# Patient Record
Sex: Female | Born: 1965
Health system: Southern US, Community
[De-identification: ages and names within clinical notes are randomized; demographics above are authoritative.]

## PROBLEM LIST (undated history)

## (undated) HISTORY — PX: MYOMECTOMY: SHX85

---

## 2000-10-01 ENCOUNTER — Encounter: Admission: RE | Admit: 2000-10-01 | Discharge: 2000-10-01 | Payer: Self-pay | Admitting: Family Medicine

## 2000-10-01 ENCOUNTER — Encounter: Payer: Self-pay | Admitting: Family Medicine

## 2000-10-20 ENCOUNTER — Ambulatory Visit (HOSPITAL_COMMUNITY): Admission: RE | Admit: 2000-10-20 | Discharge: 2000-10-20 | Payer: Self-pay | Admitting: Family Medicine

## 2000-10-20 ENCOUNTER — Encounter: Payer: Self-pay | Admitting: Obstetrics and Gynecology

## 2000-11-27 ENCOUNTER — Encounter: Payer: Self-pay | Admitting: Gynecology

## 2000-12-02 ENCOUNTER — Inpatient Hospital Stay (HOSPITAL_COMMUNITY): Admission: RE | Admit: 2000-12-02 | Discharge: 2000-12-04 | Payer: Self-pay | Admitting: Gynecology

## 2000-12-02 ENCOUNTER — Encounter (INDEPENDENT_AMBULATORY_CARE_PROVIDER_SITE_OTHER): Payer: Self-pay | Admitting: Specialist

## 2009-01-12 ENCOUNTER — Ambulatory Visit: Payer: Self-pay | Admitting: Advanced Practice Midwife

## 2009-01-12 ENCOUNTER — Inpatient Hospital Stay (HOSPITAL_COMMUNITY): Admission: AD | Admit: 2009-01-12 | Discharge: 2009-01-12 | Payer: Self-pay | Admitting: Obstetrics & Gynecology

## 2009-03-01 ENCOUNTER — Encounter: Admission: RE | Admit: 2009-03-01 | Discharge: 2009-03-01 | Payer: Self-pay | Admitting: Obstetrics and Gynecology

## 2009-03-06 ENCOUNTER — Encounter: Admission: RE | Admit: 2009-03-06 | Discharge: 2009-03-06 | Payer: Self-pay | Admitting: Obstetrics and Gynecology

## 2009-09-29 ENCOUNTER — Encounter: Admission: RE | Admit: 2009-09-29 | Discharge: 2009-09-29 | Payer: Self-pay | Admitting: Obstetrics and Gynecology

## 2010-04-10 ENCOUNTER — Encounter: Admission: RE | Admit: 2010-04-10 | Discharge: 2010-04-10 | Payer: Self-pay | Admitting: Obstetrics and Gynecology

## 2010-09-23 LAB — POCT PREGNANCY, URINE: Preg Test, Ur: NEGATIVE

## 2010-11-02 NOTE — H&P (Signed)
[facility]  Patient:    Alejandra Baker, Alejandra Baker                   MRN: [id number] Adm. Date:  [id number] Attending:  Rolinda Roan CC:         Beather Arbour. Thomasena Edis, M.D.  Dellis Anes Idell Pickles, M.D.   History and Physical  ADMITTING DIAGNOSIS:  Fibroid uterus.  HISTORY OF PRESENT ILLNESS:  The patient is a 45 year old nulligravida white female kindly referred by Dr. Lafonda Mosses B. Collins for evaluation and treatment of a large fibroid.  The patient presented with a large pelvic mass to Dr. Dellis Anes. Heller, who, after evaluation, referred the patient to Dr. Lafonda Mosses B. Collins.  Dr. Thomasena Edis performed a CT scan and MR which suggested a degenerating fibroid.  Because the uterus was greater than 22 weeks in size and the patient strongly wished to preserve her fertility, the patient was kindly referred by Dr. Lafonda Mosses B. Collins.  Two extended discussions were held with the patient regarding the potential diagnosis.  The most likely diagnosis would be a degenerating fibroid, but the possibility of uterine sarcoma and adenomyosis have also been discussed in detail with the patient.  Need for exploratory laparotomy and probable myomectomy and possible total abdominal hysterectomy have been reviewed.  The potential complications including, but not limited to, anesthesia, injury to the bowel, bladder, ureters with possible blood loss requiring transfusion and possible infection have been discussed with the patient.  If, on frozen section, the mass appears to be a sarcoma, then Dr. Reuel Boom L. Clarke-Pearson, Chief of Gynecologic Oncology, [facility], will be available as a Artist to become the primary surgeon for all indicated procedures.  Patient understands that given the size of this fibroid, the distortion of the uterus is going to be significant and there is a possibility that it will not be possible to reconstruct the uterus to avoid  hysterectomy.  The patient wishes that all measures be taken to avoid a hysterectomy if there is not cancer.  Patient understands that there is no guarantee that she will be able to achieve a pregnancy postoperatively and that there will be a risk of uterine rupture, given the very likely large involvement of the myometrium.  The patient is to undergo a bowel prep preoperatively.  Patient also understands that there is a significant increased risk of transfusion, given the size of the fibroid. Although the patient was quite anxious during both visits, she appeared to understand well the discussion of the fibroids, the options for therapy and the potential risks of the procedure.  PAST MEDICAL HISTORY:  Surgical:  None.  Medical:  Noncontributory.  MEDICATIONS:  Tylenol PM.  ALLERGIES:  None known.  SOCIAL HISTORY:  Smokes:  None.  ETOH:  None.  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  SOCIAL HISTORY:  Positive for lymphoma and bladder cancer.  PHYSICAL EXAMINATION:  HEENT:  Negative.  HEART:  Without murmurs.  LUNGS:  Clear.  BREASTS:  Without masses or discharge.  ABDOMEN:  A large pelvic mass extending well above the umbilicus.  PELVIC:  Exam reveals the BUS and vagina to be normal with an intact hymen. The cervix cannot be visualized.  The bimanual examination reveals the pelvis to be full with a smooth, firm, but not hard mass and no nodularity is palpated in the mass or in the parametrial tissue.  EXTREMITIES:  Negative.  IMPRESSION:  Large  fibroid uterus.  PLAN:  Exploratory laparotomy, probable myomectomy, possible total abdominal hysterectomy. DD:  12/03/00 TD:  12/04/00 Job: [id number]

## 2010-11-02 NOTE — Op Note (Signed)
[facility]  Patient:    Alejandra Baker, URBACH                   MRN: [id number] Proc. Date: 12/02/00 Adm. Date:  [id number] Attending:  Rolinda Roan CC:         Dellis Anes. Idell Pickles, M.D.  Beather Arbour Thomasena Edis, M.D.   Operative Report  PREOPERATIVE DIAGNOSIS:  Fibroid uterus.  POSTOPERATIVE DIAGNOSIS:  Fibroid uterus.  OPERATION PERFORMED:  Exploratory laparotomy and myomectomy.  SURGEON:  Teodora Medici, M.D.  ASSISTANT:  Beather Arbour. Thomasena Edis, M.D.  ANESTHESIA:  General endotracheal.  PREPARATION:  Betadine.  DESCRIPTION OF PROCEDURE:  With the patient in the supine position, she was prepped and draped in the routine fashion. A catheter was not placed in the uterus secondary to the inability to visualize the cervix. A midline incision was made from below the umbilicus to just above the pubic symphysis. The incision was extended down to the subcutaneous tissue and then the fascia and peritoneum were opened without difficulty. Pelvic washings were obtained which were later discarded after a negative frozen section report. Brief exploration of her upper abdomen was benign. The appendix appeared to be normal. The uterus was approximately 22 weeks in size with a large soft anterior fibroid. An effort was made to deliver the uterus through the incision but given its size vertically as well as laterally, this could not be accomplished. The incision was then extended around the umbilicus. Because the uterus extended well above the umbilicus and has for a while. The umbilicus was significantly distorted and seeing the borders of the umbilicus were difficult. After the incision was extended, the uterus was adequately delivered to perform the myomectomy. The uterus was noted to be significantly rotated with the right round ligament being almost on the top of the uterus. Both ovaries were identified and found to be normal and later the tubes were identified  and found to be normal. The bladder was identified. An effort was made to place the uterus in as much a normal position as possible and the incision to remove the fibroid was placed as close to the midline in the uterus as possible which was significantly asymmetrical with the fibroid. A dilute solution of Pitressin was injected and the uterine serosa opened with cautery. The dissection planes were found without significant difficulty and very slowly and carefully the fibroid was removed taking care not to damage the fallopian tubes, the bladder or the vessels in the broad ligament on the right side where the fibroid significantly extended to. The fibroid was removed intact and there was no apparent entry into the endometrial cavity. Because the cavity was not distended with dye, this could not be determined with certainty. However, the cavity was identified and appeared to be intact. The fibroid was sent to pathology for frozen section and Dr. Ralph Leyden returned a diagnosis of benign leiomyoma. The fibroid weighed over 900 gm. There was a tremendously large defect in the anterior wall of the uterus extending well into the broad ligament on the right side. This made very very difficult uterine reconstruction because of the amount of surface area that needed to be approximated and the amount of bleeding surface. An effort was made to control hemostasis by individually cauterizing or ligating the larger bleeders. The uterus was then reconstructed, first using running #0 chromic sutures in the direction of the muscle fibers and the defect and then as the surface was approached and then to close  the defect in the midline again taking care to protect the bladder, fallopian tubes, uterine artery and vein on the right side. Care was also taken not to intrude on the uterine cavity. This was a very difficult and tedious process. There was a significant amount of dead space left in the broad  ligament where hemostasis appeared to be intact but given the potential oozing from the incision site and repair site had a tremendous potential to collect postoperatively. With the edema in the broad ligament, it was not possible to precisely locate the uterine artery and vein and it was not possible to approximate or close this dead space. After the uterus was completely reconstructed with chromic suture used to repair the serosa pressure was applied to the broad ligament and repair site for five minutes by the clock. After that time, hemostasis appeared to be completely intact. The serosa had been closed with chromic suture given the size of the defect and tension on the suture. PDS is normally employed for this step but in the interest of hemostasis and approximation, chromic was used. The pelvis was irrigated with copious amounts of warm lactated Ringers solution and again the tubes and ovaries appeared to be normal. The bladder appeared to be out of harms way and hemostasis was intact. The abdomen was then closed in layers using a running 2-0 Vicryl on the peritoneum, running #0 PDS on the fascia from the upper and lower poles and staples used to close the skin. The estimated blood loss was 1300 cc. The sponge, needle and instrument counts were correct. The patient tolerated the procedure well and was taken to the recovery room in satisfactory condition. DD:  12/03/00 TD:  12/03/00 Job: [id number]

## 2016-08-07 ENCOUNTER — Encounter (HOSPITAL_BASED_OUTPATIENT_CLINIC_OR_DEPARTMENT_OTHER): Payer: Self-pay | Admitting: *Deleted

## 2016-08-07 ENCOUNTER — Emergency Department (HOSPITAL_BASED_OUTPATIENT_CLINIC_OR_DEPARTMENT_OTHER): Payer: BLUE CROSS/BLUE SHIELD

## 2016-08-07 ENCOUNTER — Emergency Department (HOSPITAL_BASED_OUTPATIENT_CLINIC_OR_DEPARTMENT_OTHER)
Admission: EM | Admit: 2016-08-07 | Discharge: 2016-08-07 | Disposition: A | Discharge status: Home / Self Care | Payer: BLUE CROSS/BLUE SHIELD | Attending: Emergency Medicine | Admitting: Emergency Medicine

## 2016-08-07 DIAGNOSIS — R0602 Shortness of breath: Secondary | ICD-10-CM | POA: Insufficient documentation

## 2016-08-07 DIAGNOSIS — R202 Paresthesia of skin: Secondary | ICD-10-CM

## 2016-08-07 LAB — CBC
HCT: 42.3 % (ref 36.0–46.0)
Hemoglobin: 14.3 g/dL (ref 12.0–15.0)
MCH: 28.9 pg (ref 26.0–34.0)
MCHC: 33.8 g/dL (ref 30.0–36.0)
MCV: 85.5 fL (ref 78.0–100.0)
Platelets: 319 10*3/uL (ref 150–400)
RBC: 4.95 MIL/uL (ref 3.87–5.11)
RDW: 12.7 % (ref 11.5–15.5)
WBC: 12.2 10*3/uL — ABNORMAL HIGH (ref 4.0–10.5)

## 2016-08-07 LAB — URINALYSIS, ROUTINE W REFLEX MICROSCOPIC
Bilirubin Urine: NEGATIVE
Glucose, UA: NEGATIVE mg/dL
Hgb urine dipstick: NEGATIVE
Ketones, ur: NEGATIVE mg/dL
Leukocytes, UA: NEGATIVE
Nitrite: NEGATIVE
Protein, ur: NEGATIVE mg/dL
Specific Gravity, Urine: 1.004 — ABNORMAL LOW (ref 1.005–1.030)
pH: 8 (ref 5.0–8.0)

## 2016-08-07 LAB — BASIC METABOLIC PANEL
Anion gap: 9 (ref 5–15)
BUN: 17 mg/dL (ref 6–20)
CO2: 22 mmol/L (ref 22–32)
Calcium: 9.1 mg/dL (ref 8.9–10.3)
Chloride: 104 mmol/L (ref 101–111)
Creatinine, Ser: 0.94 mg/dL (ref 0.44–1.00)
GFR calc Af Amer: >60 mL/min (ref >60)
GFR calc non Af Amer: >60 mL/min (ref >60)
Glucose, Bld: 98 mg/dL (ref 65–99)
Potassium: 3.1 mmol/L — ABNORMAL LOW (ref 3.5–5.1)
Sodium: 135 mmol/L (ref 135–145)

## 2016-08-07 LAB — D-DIMER, QUANTITATIVE: D-Dimer, Quant: 0.27 ug/mL-FEU (ref 0.00–0.50)

## 2016-08-07 LAB — TROPONIN I
Troponin I: <0.03 ng/mL (ref <0.03)
Troponin I: <0.03 ng/mL (ref <0.03)

## 2016-08-07 MED ORDER — POTASSIUM CHLORIDE CRYS ER 20 MEQ PO TBCR
40.0000 meq | EXTENDED_RELEASE_TABLET | Freq: Once | ORAL | Status: AC
  Administered 2016-08-07: 40 meq via ORAL
  Filled 2016-08-07: qty 2

## 2016-08-07 NOTE — ED Provider Notes (Signed)
[facility] Note   CSN: [id number] Arrival date & time: 08/07/16  1809  By signing my name below, I, Talbert Nan, attest that this documentation has been prepared under the direction and in the presence of Laurence Spates, MD. Electronically Signed: Talbert Nan, Scribe. 08/07/16. 7:59 PM.   History   Chief Complaint Chief Complaint  Patient presents with  . Shortness of Breath    HPI Alejandra Baker is a 51 y.o. female who presents to the Emergency Department complaining of acute onset, moderate, persistent shortness of breath that started today. Pt has associated tingling in hands and feet, ear ringing, light headedness, insomnia, dizziness that started 7 days ago. These sx are intermittent and not associated with exertion. Pt has no respiratory issues. Pt went to PCP and was given a regimen of prednisone 5 days ago for fluid behind her ear; no change in tingling sx. Pt denies any recent head injuries. Pt reports that sleeping on back is better than sleeping on her side.Pt denies nausea, vomiting, diarrhea, cold symptoms, leg swelling, long distance travel, hormone or estrogen use, fever, diaphoresis, or anxiousness. No extremity weakness or visual problems.  FH negative for early heart disease. HPI  History reviewed. No pertinent past medical history.  There are no active problems to display for this patient.   Past Surgical History:  Procedure Laterality Date  . MYOMECTOMY      OB History    No data available       Home Medications    Prior to Admission medications   Not on File    Family History History reviewed. No pertinent family history.  Social History Social History  Substance Use Topics  . Smoking status: Never Smoker  . Smokeless tobacco: Never Used  . Alcohol use No     Allergies   Patient has no known allergies.   Review of Systems Review of Systems  A complete 10 system review of systems was obtained and all  systems are negative except as noted in the HPI and PMH.   Physical Exam Updated Vital Signs BP 125/84   Pulse 94   Temp 97.9 F (36.6 C)   Resp 16   Ht 5\' 3"  (1.6 m)   Wt 139 lb (63 kg)   SpO2 100%   BMI 24.62 kg/m   Physical Exam  Constitutional: She is oriented to person, place, and time. She appears well-developed and well-nourished. No distress.  Awake, alert  HENT:  Head: Normocephalic and atraumatic.  Right Ear: Tympanic membrane and ear canal normal.  Left Ear: Tympanic membrane and ear canal normal.  Mouth/Throat: Oropharynx is clear and moist.  Eyes: Conjunctivae and EOM are normal. Pupils are equal, round, and reactive to light.  Neck: Neck supple.  Cardiovascular: Normal rate, regular rhythm and normal heart sounds.   No murmur heard. Pulmonary/Chest: Effort normal and breath sounds normal. No respiratory distress.  Abdominal: Soft. Bowel sounds are normal. She exhibits no distension. There is no tenderness.  Musculoskeletal: She exhibits no edema.  Neurological: She is alert and oriented to person, place, and time. She has normal reflexes. No cranial nerve deficit. She exhibits normal muscle tone.  Fluent speech, normal finger-to-nose testing, negative pronator drift, no clonus 5/5 strength and normal sensation x all 4 extremities  Skin: Skin is warm and dry.  Psychiatric: She has a normal mood and affect. Judgment and thought content normal.  Nursing note and vitals reviewed.    ED Treatments /  Results   DIAGNOSTIC STUDIES: Oxygen Saturation is 100% on room air, normal by my interpretation.    COORDINATION OF CARE: 8:02 PM Discussed treatment plan with pt at bedside and pt agreed to plan, which include chest Xr, d-dimer, CT scan.  Labs (all labs ordered are listed, but only abnormal results are displayed) Labs Reviewed  BASIC METABOLIC PANEL - Abnormal; Notable for the following:       Result Value   Potassium 3.1 (*)    All other components within  normal limits  CBC - Abnormal; Notable for the following:    WBC 12.2 (*)    All other components within normal limits  TROPONIN I  URINALYSIS, ROUTINE W REFLEX MICROSCOPIC  D-DIMER, QUANTITATIVE ([facility])    EKG  EKG Interpretation  Date/Time:  Wednesday August 07 2016 18:23:51 EST Ventricular Rate:  105 PR Interval:  126 QRS Duration: 82 QT Interval:  360 QTC Calculation: 475 R Axis:   64 Text Interpretation:  Sinus tachycardia Nonspecific ST abnormality Abnormal ECG ST depression in precordial leads, no previous available Confirmed by Rochanda Harpham MD, Ethne Jeon [phone]([id number]) on 08/07/2016 6:29:31 PM       Radiology Dg Chest 2 View  Result Date: 08/07/2016 CLINICAL DATA:  Shortness of breath EXAM: CHEST  2 VIEW COMPARISON:  None. FINDINGS: The heart size and mediastinal contours are within normal limits. Both lungs are clear. Possible small calcified nodules in the left apex. The visualized skeletal structures are unremarkable. IMPRESSION: No active cardiopulmonary disease. Electronically Signed   By: Jasmine PangKim  Fujinaga M.D.   On: 08/07/2016 19:05    Procedures Procedures (including critical care time)  Medications Ordered in ED Medications  potassium chloride SA (K-DUR,KLOR-CON) CR tablet 40 mEq (not administered)     Initial Impression / Assessment and Plan / ED Course  I have reviewed the triage vital signs and the nursing notes.  Pertinent labs & imaging results that were available during my care of the patient were reviewed by me and considered in my medical decision making (see chart for details).     PT w/ intermittent tingling of all extremities x 1 week assoc w/ dizziness and now SOB that began this afternoon. No CP and no relation to exertion. Pt was well appearing on exam w/ normal VS, O2 100% on RA. Normal neurologic exam. She denied any complaints of weakness, balance problems, or visual changes to suggest intracranial process. Obtained above labs including serial  troponins. All lab work unremarkable. Her EKG has nonspecific changes but no previous available for comparison. No risk factors for ACS, no FH of heart disease. I highly doubt ACS given no chest pain and atypical story. D-dimer negative therefore PE very unlikely. Potassium slightly low, I have given oral repletion and suggested dietary supplementation w/ foods. I have instructed to f/u with PCP regarding her sx. Extensively reviewed return precautions. She voiced understanding and was discharged in satisfactory condition.    Final Clinical Impressions(s) / ED Diagnoses   Final diagnoses:  SOB (shortness of breath)  Tingling in extremities    New Prescriptions New Prescriptions   No medications on file  I personally performed the services described in this documentation, which was scribed in my presence. The recorded information has been reviewed and is accurate.     Laurence Spatesachel Morgan Novella Abraha, MD 08/10/16 [phone]

## 2016-08-07 NOTE — ED Notes (Signed)
Pt appears somewhat anxious; denies pain at this time; sts tingling is not as bad.

## 2016-08-07 NOTE — ED Notes (Signed)
Patient transported to X-ray 

## 2016-08-07 NOTE — ED Triage Notes (Addendum)
Pt c/o SOB , dizziness with all extr "tingling" x 1 week, pt speaking in complete sentences

## 2017-07-25 IMAGING — DX DG CHEST 2V
2 series · 2 of 2 positions shown · non-contrast
Comparison: None.

CLINICAL DATA: Shortness of breath

EXAM:
CHEST  2 VIEW

[chest pa]
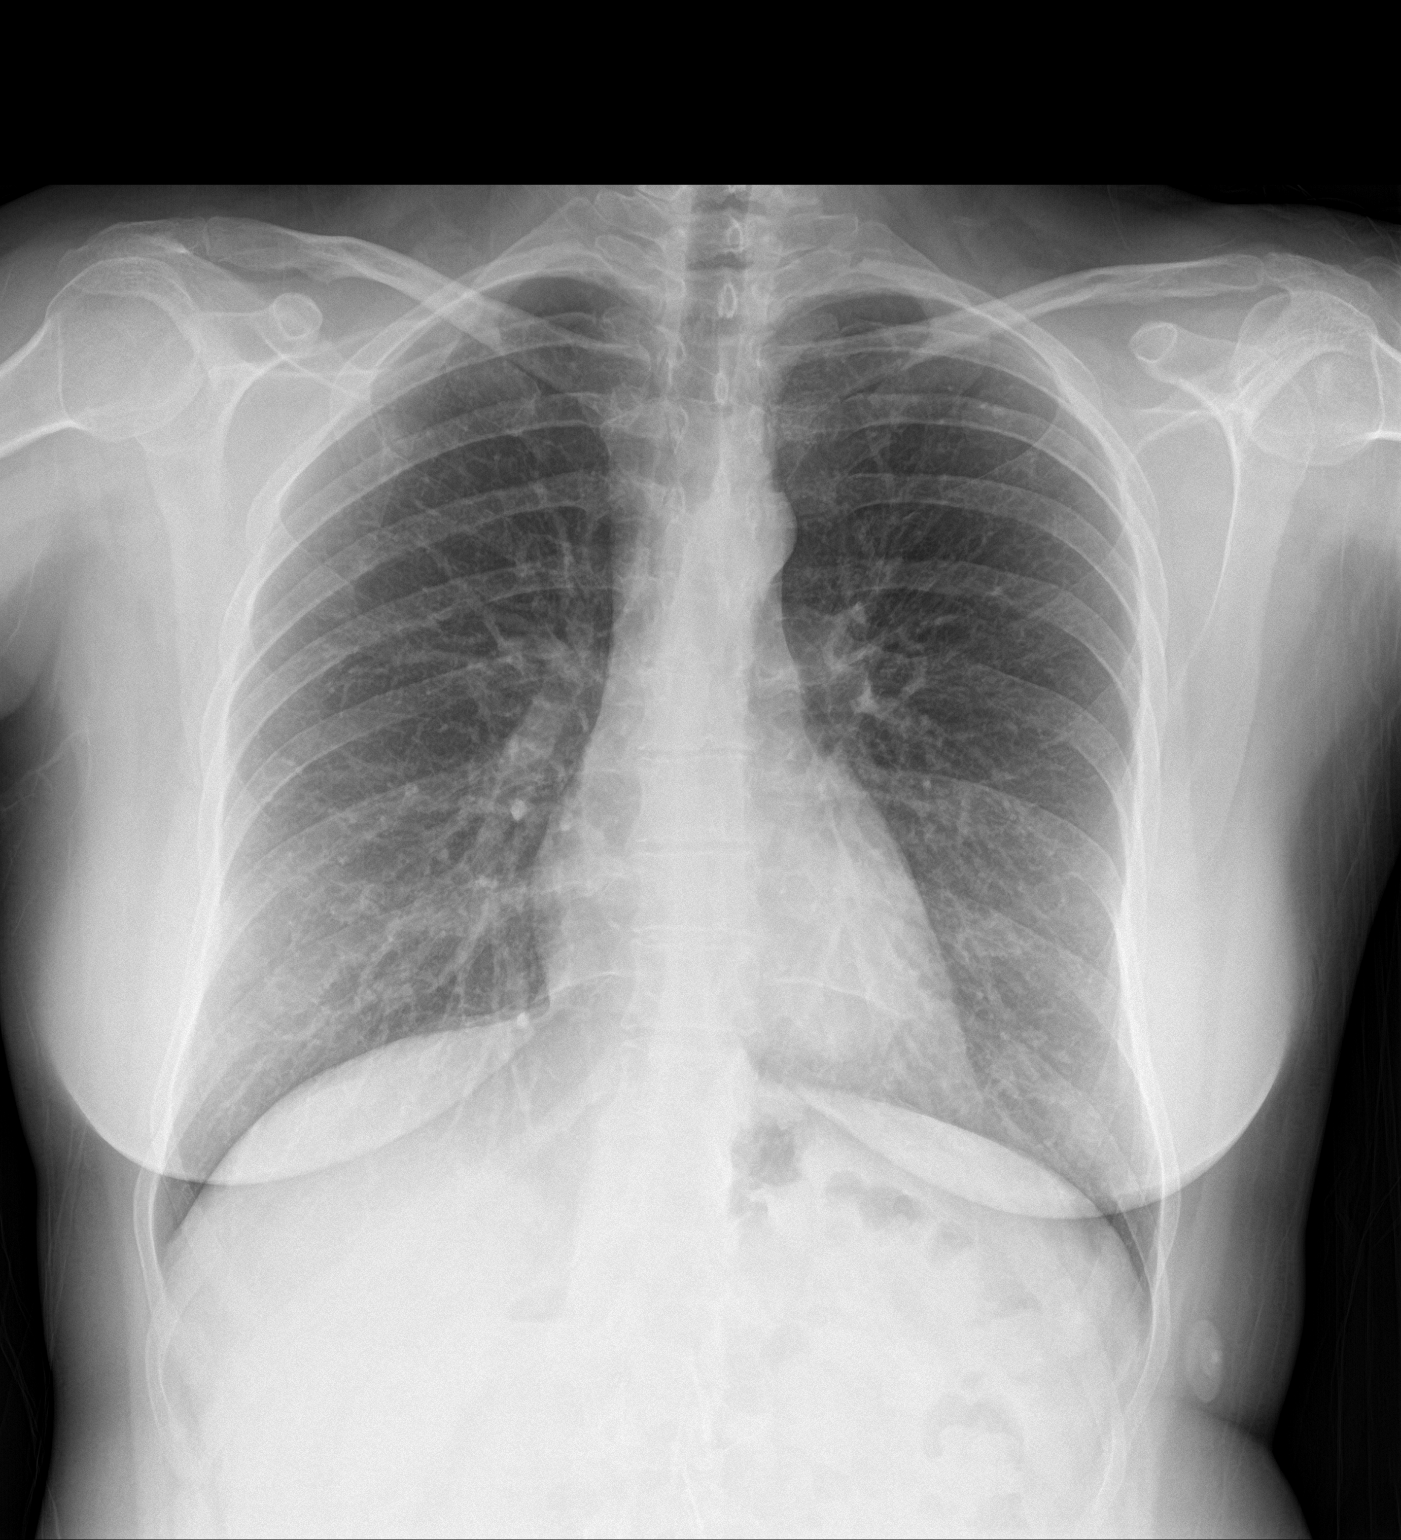

[chest lat]
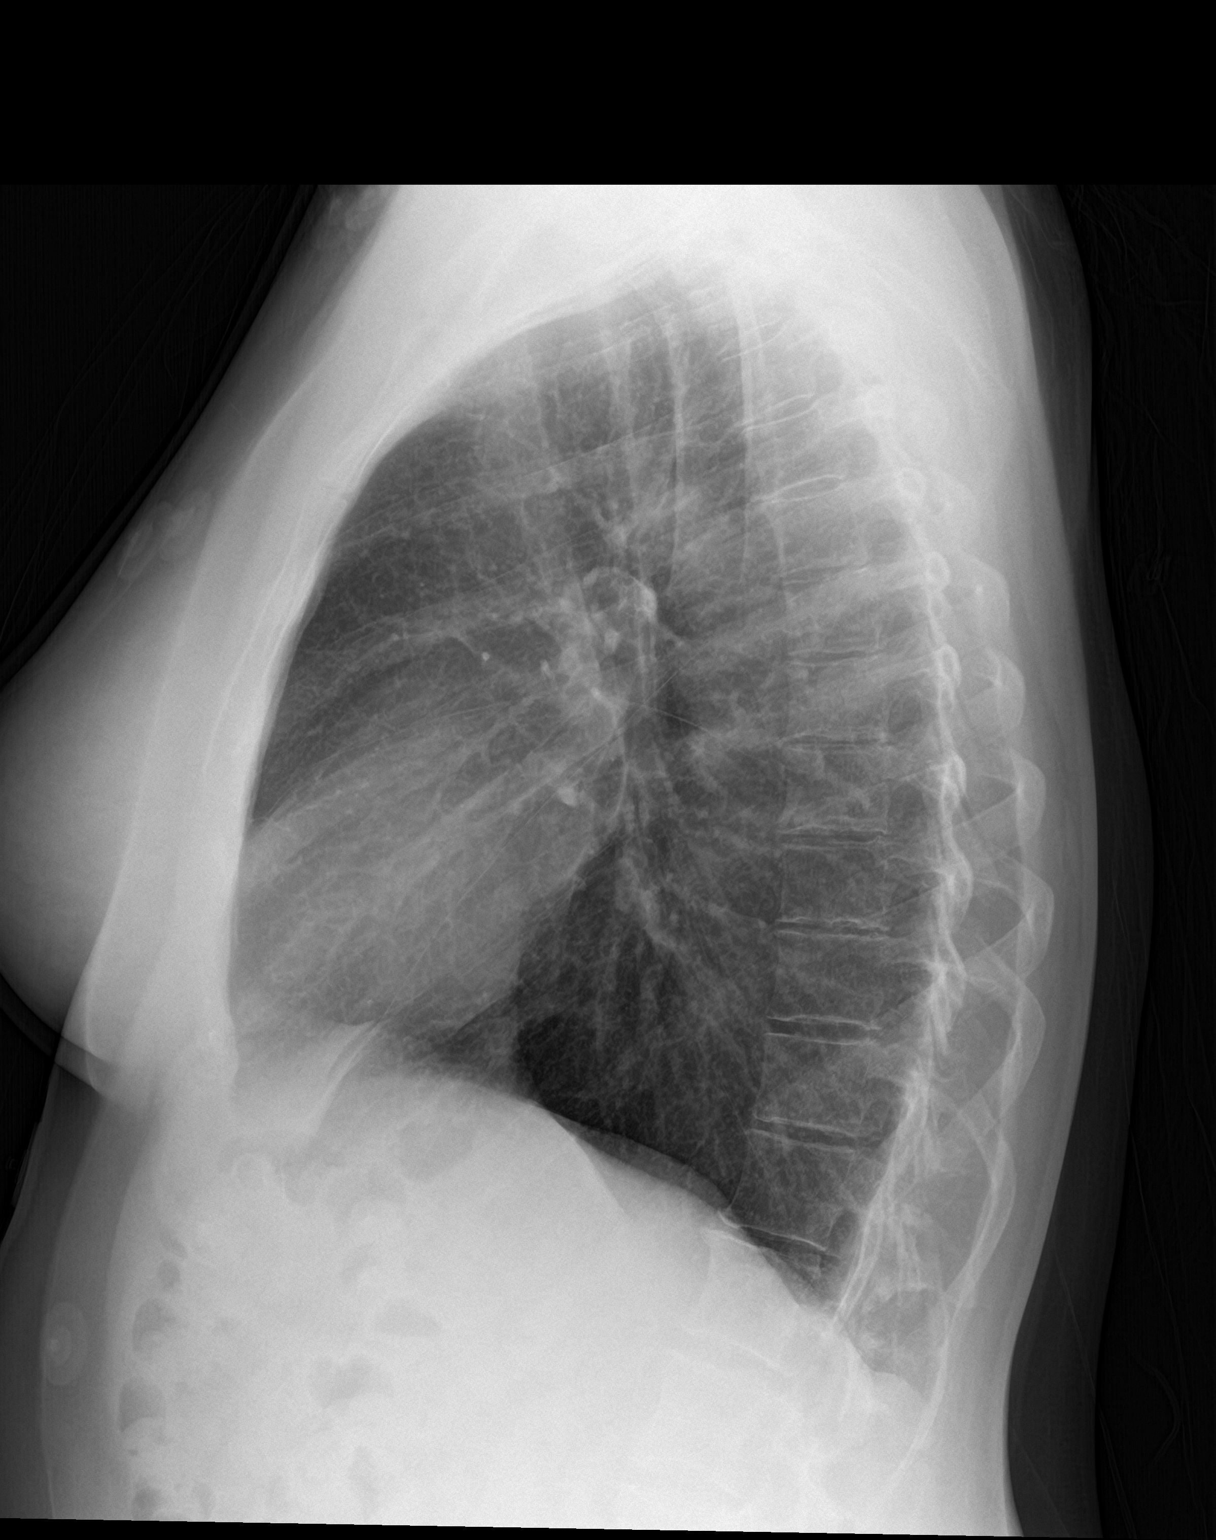

[2 of 2 positions shown; findings below may reference images not displayed]

FINDINGS: The heart size and mediastinal contours are within normal limits.
Both lungs are clear. Possible small calcified nodules in the left
apex. The visualized skeletal structures are unremarkable.
IMPRESSION: No active cardiopulmonary disease.

## 2019-05-31 ENCOUNTER — Other Ambulatory Visit: Payer: Self-pay | Admitting: Family Medicine

## 2019-05-31 DIAGNOSIS — R921 Mammographic calcification found on diagnostic imaging of breast: Secondary | ICD-10-CM

## 2019-08-28 ENCOUNTER — Ambulatory Visit: Payer: Self-pay | Attending: Internal Medicine

## 2019-08-28 DIAGNOSIS — Z23 Encounter for immunization: Secondary | ICD-10-CM

## 2019-08-28 NOTE — Progress Notes (Signed)
   Covid-19 Vaccination Clinic  Name:  Alejandra Baker    MRN: [id number] DOB: 10-09-1965  08/28/2019  Alejandra Baker was observed post Covid-19 immunization for 15 minutes without incident. She was provided with Vaccine Information Sheet and instruction to access the V-Safe system.   Alejandra Baker was instructed to call 911 with any severe reactions post vaccine: Marland Kitchen Difficulty breathing  . Swelling of face and throat  . A fast heartbeat  . A bad rash all over body  . Dizziness and weakness   Immunizations Administered    Name Date Dose VIS Date Route   Pfizer COVID-19 Vaccine 08/28/2019  1:45 PM 0.3 mL 05/28/2019 Intramuscular   Manufacturer: [employer], [employer]   Lot: [id number]   NDC: [address]-0

## 2019-09-21 ENCOUNTER — Ambulatory Visit: Payer: Self-pay

## 2019-09-21 ENCOUNTER — Ambulatory Visit: Payer: Self-pay | Attending: Internal Medicine

## 2019-09-21 DIAGNOSIS — Z23 Encounter for immunization: Secondary | ICD-10-CM

## 2019-09-21 NOTE — Progress Notes (Signed)
   Covid-19 Vaccination Clinic  Name:  Alejandra Baker    MRN: [id number] DOB: [birth year]  09/21/2019  Alejandra Baker was observed post Covid-19 immunization for 15 minutes without incident. She was provided with Vaccine Information Sheet and instruction to access the V-Safe system.   Alejandra Baker was instructed to call 911 with any severe reactions post vaccine: Marland Kitchen Difficulty breathing  . Swelling of face and throat  . A fast heartbeat  . A bad rash all over body  . Dizziness and weakness   Immunizations Administered    Name Date Dose VIS Date Route   Pfizer COVID-19 Vaccine 09/21/2019  2:33 PM 0.3 mL 05/28/2019 Intramuscular   Manufacturer: [employer], [employer]   Lot: [id number]   NDC: [address]-0

## 2020-06-12 ENCOUNTER — Other Ambulatory Visit (HOSPITAL_BASED_OUTPATIENT_CLINIC_OR_DEPARTMENT_OTHER): Payer: Self-pay | Admitting: Internal Medicine

## 2020-06-12 ENCOUNTER — Ambulatory Visit: Payer: BC Managed Care – PPO | Attending: Internal Medicine

## 2020-06-12 DIAGNOSIS — Z23 Encounter for immunization: Secondary | ICD-10-CM

## 2020-06-12 MED FILL — PFIZER-BIONTECH COVID-19 VA: 30 | 21 days supply | Qty: 0 | Fill #0

## 2020-06-12 NOTE — Progress Notes (Signed)
   Covid-19 Vaccination Clinic  Name:  Alejandra Baker    MRN: [id number] DOB: [birth year]  06/12/2020  Alejandra Baker was observed post Covid-19 immunization for 15 minutes without incident. She was provided with Vaccine Information Sheet and instruction to access the V-Safe system.   Alejandra Baker was instructed to call 911 with any severe reactions post vaccine: Marland Kitchen Difficulty breathing  . Swelling of face and throat  . A fast heartbeat  . A bad rash all over body  . Dizziness and weakness   Immunizations Administered    Name Date Dose VIS Date Route   Pfizer COVID-19 Vaccine 06/12/2020 11:42 AM 0.3 mL 04/05/2020 Intramuscular   Manufacturer: [employer], [employer]   Lot: [id number]   NDC: [id number]

## 2023-02-03 ENCOUNTER — Other Ambulatory Visit (HOSPITAL_BASED_OUTPATIENT_CLINIC_OR_DEPARTMENT_OTHER): Payer: Self-pay
